# Patient Record
Sex: Male | Born: 1970 | Race: White | Hispanic: No | Marital: Married
Health system: Southern US, Community
[De-identification: ages and names within clinical notes are randomized; demographics above are authoritative.]

---

## 2000-12-12 ENCOUNTER — Encounter: Payer: Self-pay | Admitting: Gastroenterology

## 2000-12-12 ENCOUNTER — Inpatient Hospital Stay (HOSPITAL_COMMUNITY): Admission: RE | Admit: 2000-12-12 | Discharge: 2000-12-13 | Payer: Self-pay | Admitting: Emergency Medicine

## 2000-12-13 ENCOUNTER — Encounter: Payer: Self-pay | Admitting: Gastroenterology

## 2001-05-05 ENCOUNTER — Emergency Department (HOSPITAL_COMMUNITY): Admission: EM | Admit: 2001-05-05 | Discharge: 2001-05-05 | Payer: Self-pay

## 2001-05-16 ENCOUNTER — Encounter (INDEPENDENT_AMBULATORY_CARE_PROVIDER_SITE_OTHER): Payer: Self-pay | Admitting: *Deleted

## 2001-05-16 ENCOUNTER — Ambulatory Visit (HOSPITAL_COMMUNITY): Admission: RE | Admit: 2001-05-16 | Discharge: 2001-05-16 | Payer: Self-pay | Admitting: Gastroenterology

## 2007-08-10 ENCOUNTER — Ambulatory Visit (HOSPITAL_COMMUNITY): Admission: RE | Admit: 2007-08-10 | Discharge: 2007-08-10 | Payer: Self-pay | Admitting: Gastroenterology

## 2010-05-27 ENCOUNTER — Encounter
Admission: RE | Admit: 2010-05-27 | Discharge: 2010-05-27 | Payer: Self-pay | Source: Home / Self Care | Attending: Gastroenterology | Admitting: Gastroenterology

## 2010-10-02 NOTE — H&P (Signed)
[facility]  Patient:    Bobby Cruz, Bobby Cruz                      MRN: [id number] Adm. Date:  [id number] Attending:  Rich Brave CC:         Florencia Reasons, M.D.  Julieanne Cotton, M.D., [place], [place]   History and Physical  REASON FOR ADMISSION:  Nausea, vomiting and abdominal pain.  HISTORY:  A 40 year old who has had several months of intermittent abdominal pain that is left upper quadrant and feels "squishy."  He is unable to sit up.  He has severe pain in this area.  He notes that pain has been coming and going for several months and has gotten progressively worse.  It is often associated with nausea and vomiting and diarrhea.  He is unable to eat and tends to throw things back up.  He has seen Dr. Orville Govern in [place] and apparently has had labs, ultrasound and CT done over the past two or three months that failed to reveal a source of his pain.  Today, the pain was much worse to the point that he just doubled over and could barely stand up.  He denies any trauma to this area.  He has not had any hematemesis, heartburn, indigestion.  He took a bottle of Mylanta without any improvement and took some Pepto-Bismol without improvement.  Given all this, he came to the emergency room.  CURRENT MEDICATIONS:  None.  ALLERGIES:  He is allergic to PENICILLIN and ERYTHROMYCIN.  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  He has recently had gastroesophageal reflux disease and has been treated in the past.  He has also had a colonoscopy for removal of juvenile polyps.  He has never had any abdominal surgery.  FAMILY HISTORY:  Parents have no chronic GI diseases.  SOCIAL HISTORY:  He is not a smoker or drinks significantly.  He does have a significant other who lives with him and a lot of the history is obtained from her.  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  PHYSICAL EXAMINATION:  VITAL SIGNS:  Patient is afebrile.  Pulse 101, blood pressure 118/86, supine, rising to 114 and blood pressure 124/83, standing.  HEENT:  Eyes:  Sclerae nonicteric.  NECK:  Supple.  No lymphadenopathy.  LUNGS:  Clear.  HEART:  Regular rate and rhythm without murmurs or gallops.  ABDOMEN:  Soft with good bowel sounds.  There is mild localized tenderness in the left upper quadrant, possibly worse with a deep breath.  No masses are palpated.  RECTAL:  Stool is brown and heme-negative.  PERTINENT LABORATORY DATA:  CBC and CMET all normal.  Acute abdominal series shows mild-to-moderate ileus throughout, particularly in the left upper quadrant.  ASSESSMENT:  Nausea, vomiting and left upper quadrant pain with an ileus on films could all be simply a viral gastroenteritis, but given the nature of this and the fact that things have gotten so much worse today, I think we ought to rule out some sort of splenic lesion.  It is also possible this could be due to kidney stone, although its location is a bit unusual.  PLAN: 1. We will put him in on evaluation and watch him with IV fluids in case    there is any decompensation. 2. We will obtain a  CT of the abdomen to rule out a splenic infarct, etc. 3. We will put him empirically on PPI. 4. We will check a urinalysis. DD:  12/12/00 TD:  12/13/00 Job: [id number]

## 2010-10-02 NOTE — Discharge Summary (Signed)
[facility]  Patient:    Bobby, Cruz                      MRN: [id number] Adm. Date:  [id number] Disc. Date: [id number] Attending:  Rich Brave CC:         Julieanne Cotton, M.D., [place], [place]   Discharge Summary  FINAL DIAGNOSES: 1. Nausea, vomiting, abdominal pain, and diarrhea of unclear cause, subacute,    improved. 2. Chronic left upper quadrant pain. 3. Skin rash, etiology unclear, acute. 4. History of childhood brain tumor.  CONSULTATIONS:  None.  COMPLICATIONS:  None.  PROCEDURES:  CT scan of the abdomen and pelvis.  LABORATORY DATA:  CMET normal on admission except for potassium of 3.3 and total bilirubin of 1.3.  Amylase low normal at 15.  White count normal at 8200, with 75 polys and 16 lymphs.  Hemoglobin 15.1.  HOSPITAL COURSE:  Bobby Cruz was admitted through the emergency room because of symptoms of nausea, vomiting, abdominal pain, and diarrhea of approximately three days duration, associated also with a low-grade fever. There was a question of an ileus on his admitting plain abdominal film. Laboratories and exam were unremarkable, and there was no clinical evidence of dehydration such as elevation of BUN, which was less than 5 on admission.  He was brought in, given Stadol for abdominal pain, and IV fluids.  A CT scan of the abdomen and pelvis was performed the next morning, which showed a small amount of free fluid in the pelvis which was of uncertain clinical significance.  His symptoms resolved.  Specifically, he had no further diarrhea, with resolution of his abdominal pain following the Stadol, and was able to take liquids without any vomiting and without significant nausea. Discharge to home was, therefore, felt to be appropriate.  In the meantime, he developed an erythematous, urticarial-type reaction or skin rash in his axillae, groin, his waistband, around his forehead, and at the creases of his  forearms.  This was mildly pruritic.  This appeared approximately six hours after a CT scan on the day of discharge and is presumed most likely to be either drug rash or possibly a viral skin eruption. It was not felt sufficiently severe to necessitate keeping the patient in the hospital, and the patient, indeed, felt ready to go home.  DISPOSITION:  The patient is discharged home.  DISCHARGE INSTRUCTIONS:  We will provide him a kit to collect a stool specimen for ova and parasites.  He may engage in light activity as tolerated, and consume a regular diet, to be advanced as tolerated beginning with small light meals.  He is to call if he has recurrent symptoms which are not relieved by symptomatic therapy (see below).  FOLLOW-UP:  He should call for an appointment with me for follow-up in about two to three weeks.  DISCHARGE MEDICATIONS: 1. Benadryl 25 mg p.o. q.4h. p.r.n. for rash or itching. 2. Vicodin 1-2 tablets q.4h. p.r.n. for cramps, abdominal pain, or diarrhea. 3. Phenergan 25 mg 1/2 to 1 tablet p.o. q.6h. p.r.n. nausea. 4. Imodium over-the-counter 2 caplets every four hours as needed for diarrhea.  CONDITION ON DISCHARGE:  Improved. DD:  12/13/00 TD:  12/14/00 Job: [id number]

## 2010-10-02 NOTE — Procedures (Signed)
[place]  Patient:    KEWON, STATLER Visit Number: [id number] MRN: [id number]          Service Type: END Location: ENDO Attending Physician:  Rich Brave Dictated by:   Florencia Reasons, M.D. Proc. Date: 05/16/01 Admit Date:  05/16/2001   CC:         Sheppard Plumber. Earlene Plater, M.D.   Procedure Report  PROCEDURE:  Colonoscopy with biopsies.  INDICATION:  A 40 year old with transiently Hemoccult-positive stool and a history of recurring left-sided abdominal pain and diarrhea.  He also had a recent episode of minimal hematochezia.  FINDINGS:  Diminutive rectal polyp, otherwise normal to terminal ileum.  DESCRIPTION OF PROCEDURE:  The nature, purpose, and risks of the procedure were familiar to the patient, who provided written consent.  Sedation for this procedure and the upper endoscopy which preceded it totalled fentanyl 90 mcg and Versed 9 mg IV without arrhythmias or desaturation.  The Olympus adult video colonoscope was advanced around a somewhat redundant colon following an unremarkable digital rectal exam, including the prostate gland.  With the patient in the supine and right lateral decubitus positions and with external abdominal compression and the patient back in the supine position, we were able to reach the cecum and then I was able to enter the terminal ileum for quite a distance, probably 20 or 25 cm.  It had a completely normal appearance, but several biopsies were obtained.  Pullback was then performed around the colon.  The quality of the prep was fair.  There was puddling of liquid stool here and there, and it was difficult to suction this off due to the presence of some retained vegetable matter.  However, it is not felt that any significant lesions or diffuse mucosal abnormalities would have been missed during this exam.  Retroflexion in the rectum was unremarkable, although again there was some puddling of  stool in the rectum.  A 2 mm distal rectal polyp was removed by a single cold biopsy.  Random mucosal biopsies were obtained along the length of the colon.  Both retroflex viewing and pull-out through the anal canal demonstrated no major abnormalities, just some hypertrophied anal papillae.  Specifically, I did not see significant internal hemorrhoids.  The patient tolerated the procedure well, and there were no apparent complications.  IMPRESSION: 1. Redundant colon, probable anatomic variant. 2. No source of heme-positive stool identified. 3. No definite source of rectal bleeding identified, although is is presumed    to be due to hemorrhoids given the absence of any alternative explanation. 4. Diminutive rectal polyp. 5. No source or abdominal pain or diarrhea seen.  PLAN:  Await pathology on biopsies. Dictated by:   Florencia Reasons, M.D. Attending Physician:  Rich Brave DD:  05/16/01 TD:  05/16/01 Job: [id number]

## 2010-10-02 NOTE — Procedures (Signed)
[name]  Patient:    Bobby Cruz, Bobby Cruz Visit Number: [id number] MRN: [id number]          Service Type: END Location: ENDO Attending Physician:  Rich Brave Dictated by:   Florencia Reasons, M.D. Proc. Date: 05/16/01 Admit Date:  05/16/2001   CC:         Sheppard Plumber. Earlene Plater, M.D.   Procedure Report  PROCEDURE:  Upper endoscopy with biopsies.  INDICATION:  This is a 40 year old young man with transiently Hemoccult-positive stool and recurring unexplained symptoms of diarrhea and abdominal pain.  FINDINGS:  Minimal hiatal hernia and esophageal ring.  DESCRIPTION OF PROCEDURE:  The nature, purpose, and risks of the procedure were familiar to the patient, who provided written consent.  Sedation for this procedure was fentanyl 75 mcg and Versed 7.5 mg IV without arrhythmias or desaturation.  The Olympus small-caliber adult video endoscope is passed under direct vision without difficulty.  The vocal cords looked normal.  The esophagus was readily entered.  The esophageal mucosa was normal, and specifically there was no evidence of reflux esophagitis, Barretts esophagus, free gastroesophageal reflux, varices, infection, or neoplasia.  There was a fairly prominent squamocolumnar junction, below which was a 1-2 cm hiatal hernia.  No discrete esophageal ring was identified during this exam, however.  The stomach contained no significant residual and had normal mucosa.  The minimal hiatal hernia was visible from the inferior perspective.  No gastritis, erosions, ulcers, polyps, or masses were noted.  The pylorus, duodenal bulb, second and third portions of the duodenum looked normal. Multiple duodenal biopsies were obtained to help rule out celiac disease, but the villous pattern of the duodenal mucosa looked normal.  The patient tolerated the procedure well, and there were no apparent complications.  IMPRESSION:  Small hiatal hernia.   Otherwise normal endoscopy.  PLAN:  Await pathology on duodenal biopsies.  Proceed to colonoscopic evaluation. Dictated by:   Florencia Reasons, M.D. Attending Physician:  Rich Brave DD:  05/16/01 TD:  05/16/01 Job: [id number]

## 2012-08-22 ENCOUNTER — Other Ambulatory Visit (HOSPITAL_COMMUNITY): Payer: Self-pay | Admitting: Gastroenterology

## 2012-08-22 DIAGNOSIS — R109 Unspecified abdominal pain: Secondary | ICD-10-CM

## 2012-08-22 DIAGNOSIS — R110 Nausea: Secondary | ICD-10-CM

## 2012-08-25 ENCOUNTER — Other Ambulatory Visit (HOSPITAL_COMMUNITY): Payer: Self-pay

## 2012-09-05 ENCOUNTER — Ambulatory Visit
Admission: RE | Admit: 2012-09-05 | Discharge: 2012-09-05 | Disposition: A | Discharge status: Home / Self Care | Payer: BC Managed Care – PPO | Source: Ambulatory Visit | Attending: Gastroenterology | Admitting: Gastroenterology

## 2012-09-05 ENCOUNTER — Other Ambulatory Visit: Payer: Self-pay | Admitting: Gastroenterology

## 2012-09-05 ENCOUNTER — Encounter (HOSPITAL_COMMUNITY)
Admission: RE | Admit: 2012-09-05 | Discharge: 2012-09-05 | Disposition: A | Discharge status: Home / Self Care | Payer: BC Managed Care – PPO | Source: Ambulatory Visit | Attending: Gastroenterology | Admitting: Gastroenterology

## 2012-09-05 DIAGNOSIS — R110 Nausea: Secondary | ICD-10-CM | POA: Insufficient documentation

## 2012-09-05 DIAGNOSIS — R140 Abdominal distension (gaseous): Secondary | ICD-10-CM

## 2012-09-05 DIAGNOSIS — R109 Unspecified abdominal pain: Secondary | ICD-10-CM | POA: Insufficient documentation

## 2012-09-05 MED ORDER — TECHNETIUM TC 99M SULFUR COLLOID
2.0000 | Freq: Once | INTRAVENOUS | Status: AC | PRN
  Administered 2012-09-05: 2 via INTRAVENOUS

## 2013-06-28 ENCOUNTER — Ambulatory Visit
Admission: RE | Admit: 2013-06-28 | Discharge: 2013-06-28 | Disposition: A | Discharge status: Home / Self Care | Payer: BC Managed Care – PPO | Source: Ambulatory Visit | Attending: Gastroenterology | Admitting: Gastroenterology

## 2013-06-28 ENCOUNTER — Other Ambulatory Visit: Payer: Self-pay | Admitting: Gastroenterology

## 2013-06-28 DIAGNOSIS — R1084 Generalized abdominal pain: Secondary | ICD-10-CM

## 2014-11-19 IMAGING — CR DG ABDOMEN 1V
2 series · 2 of 2 positions shown · non-contrast
Comparison: None.

CLINICAL DATA: Abdominal pain, bloating.

ABDOMEN - 1 VIEW

[t abdomen supine (1 of 2)]
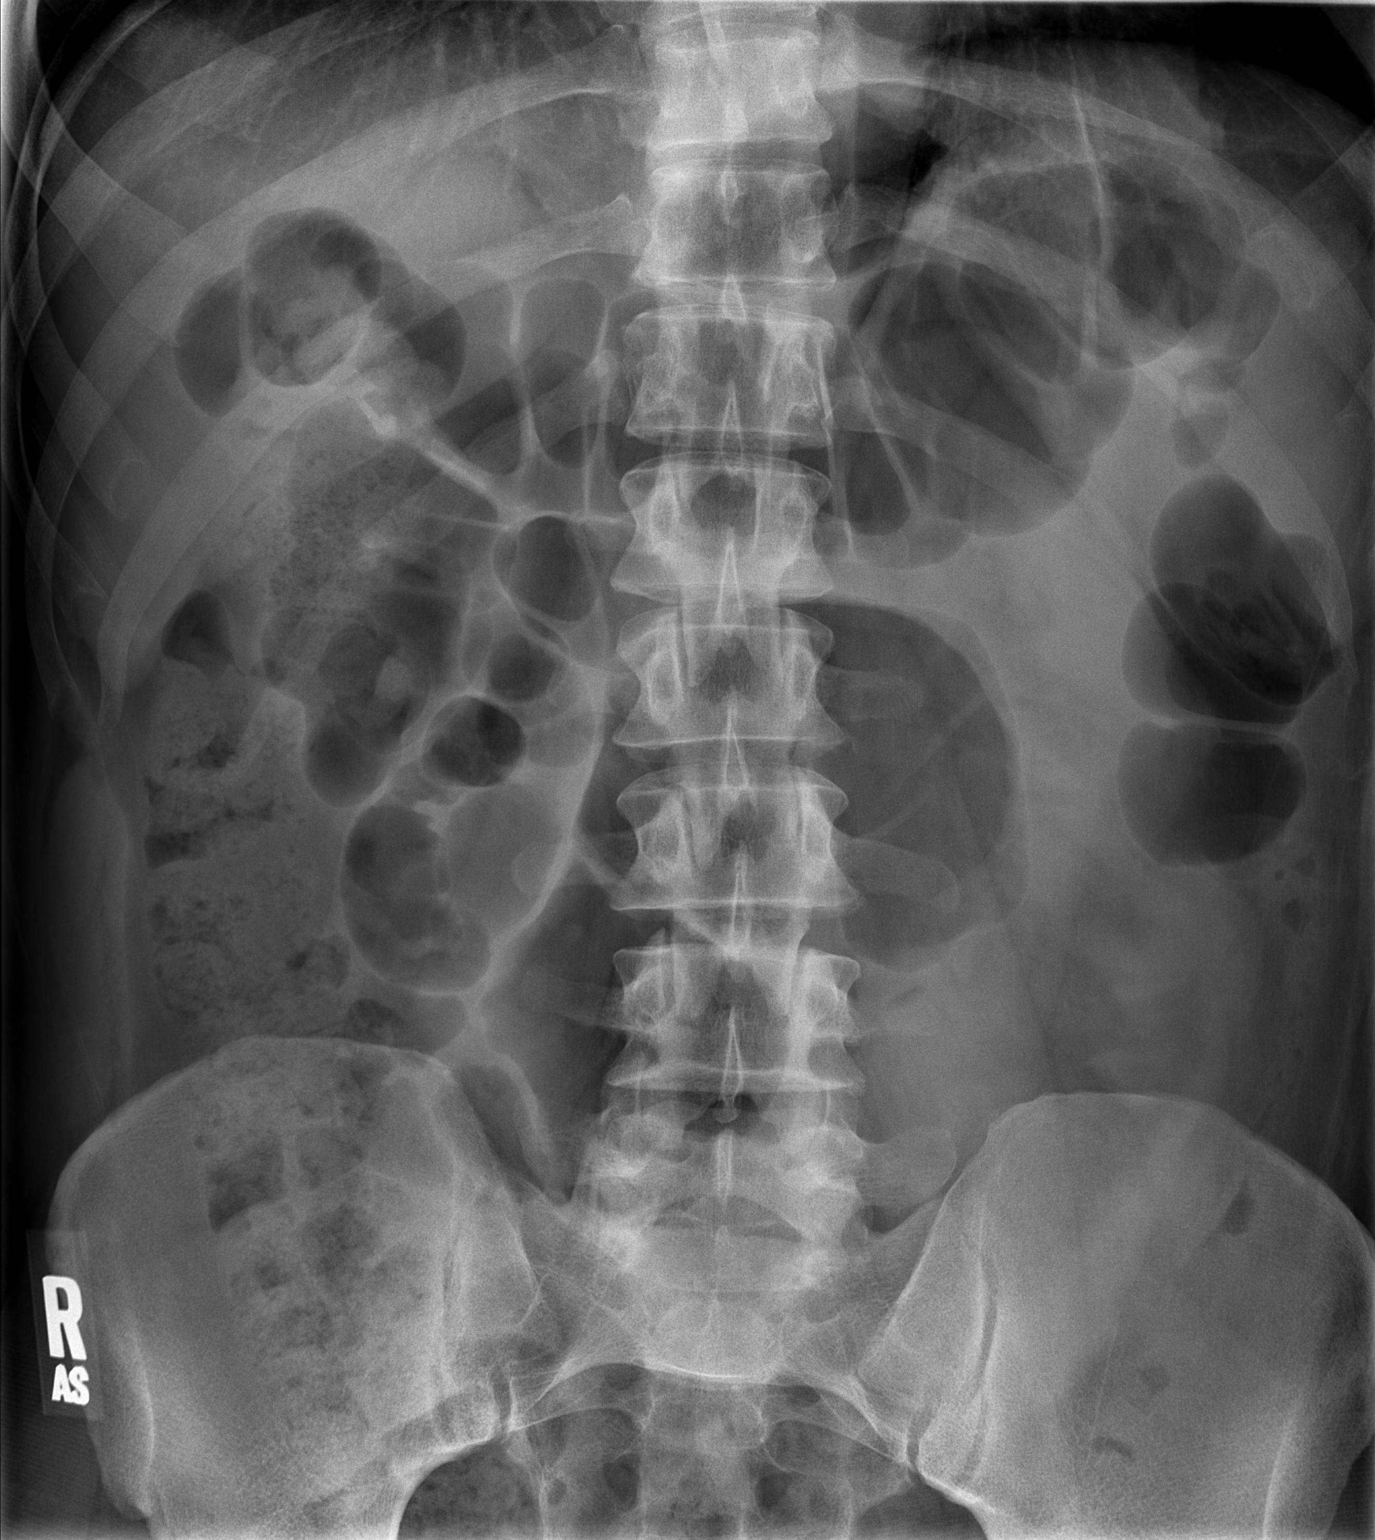

[t abdomen supine (2 of 2)]
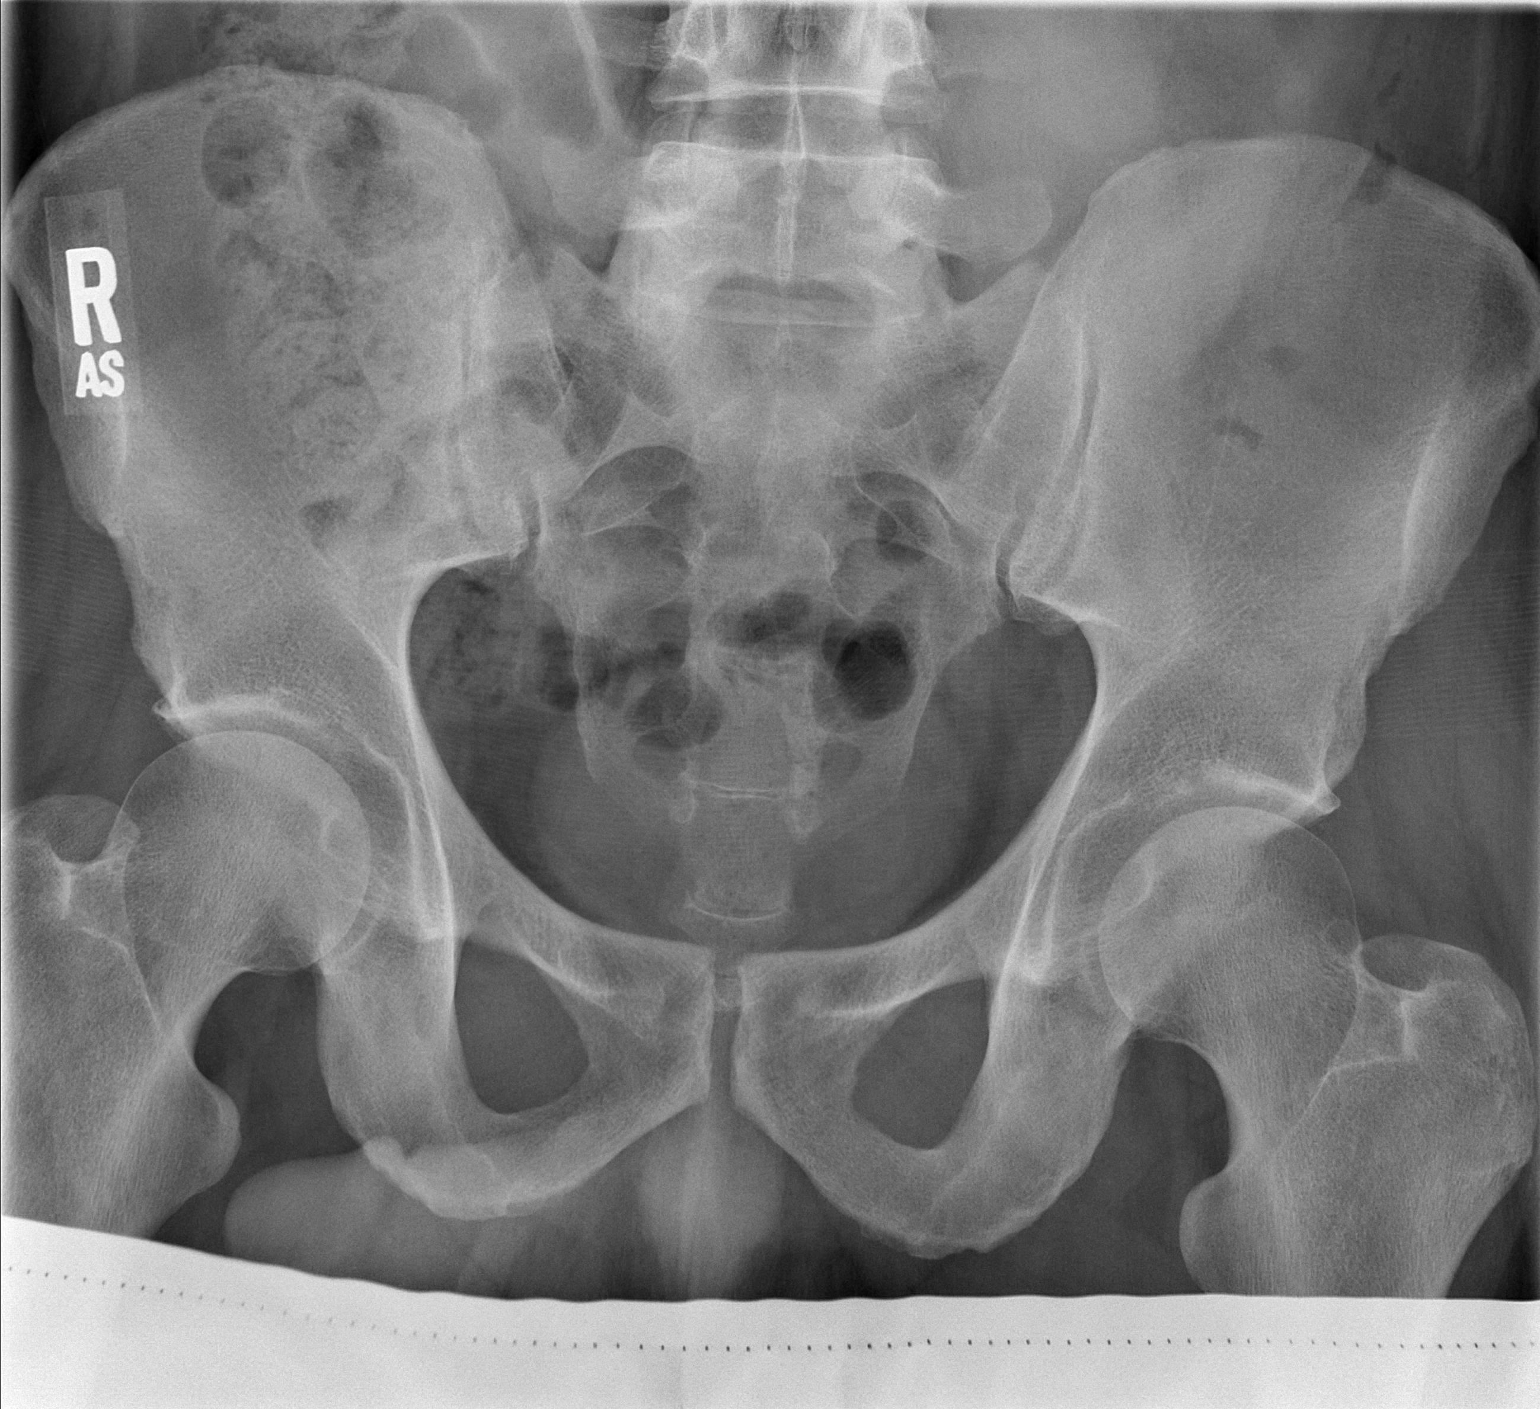

[2 of 2 positions shown; findings below may reference images not displayed]

FINDINGS: Mild gaseous distention of the colon.  Moderate stool
burden in the right side of the colon.  No evidence of bowel
obstruction.  No free air.  No organomegaly or suspicious
calcification.  No acute bony abnormality.
IMPRESSION: Mild stool burden in the right colon with mild gaseous distention
of the colon.

## 2015-09-11 IMAGING — CR DG ABDOMEN 2V
2 series · 2 of 2 positions shown · non-contrast
Comparison: 09/03/2012

CLINICAL DATA: Nausea and constipation.

EXAM:
ABDOMEN - 2 VIEW

[w abdomen upright *]
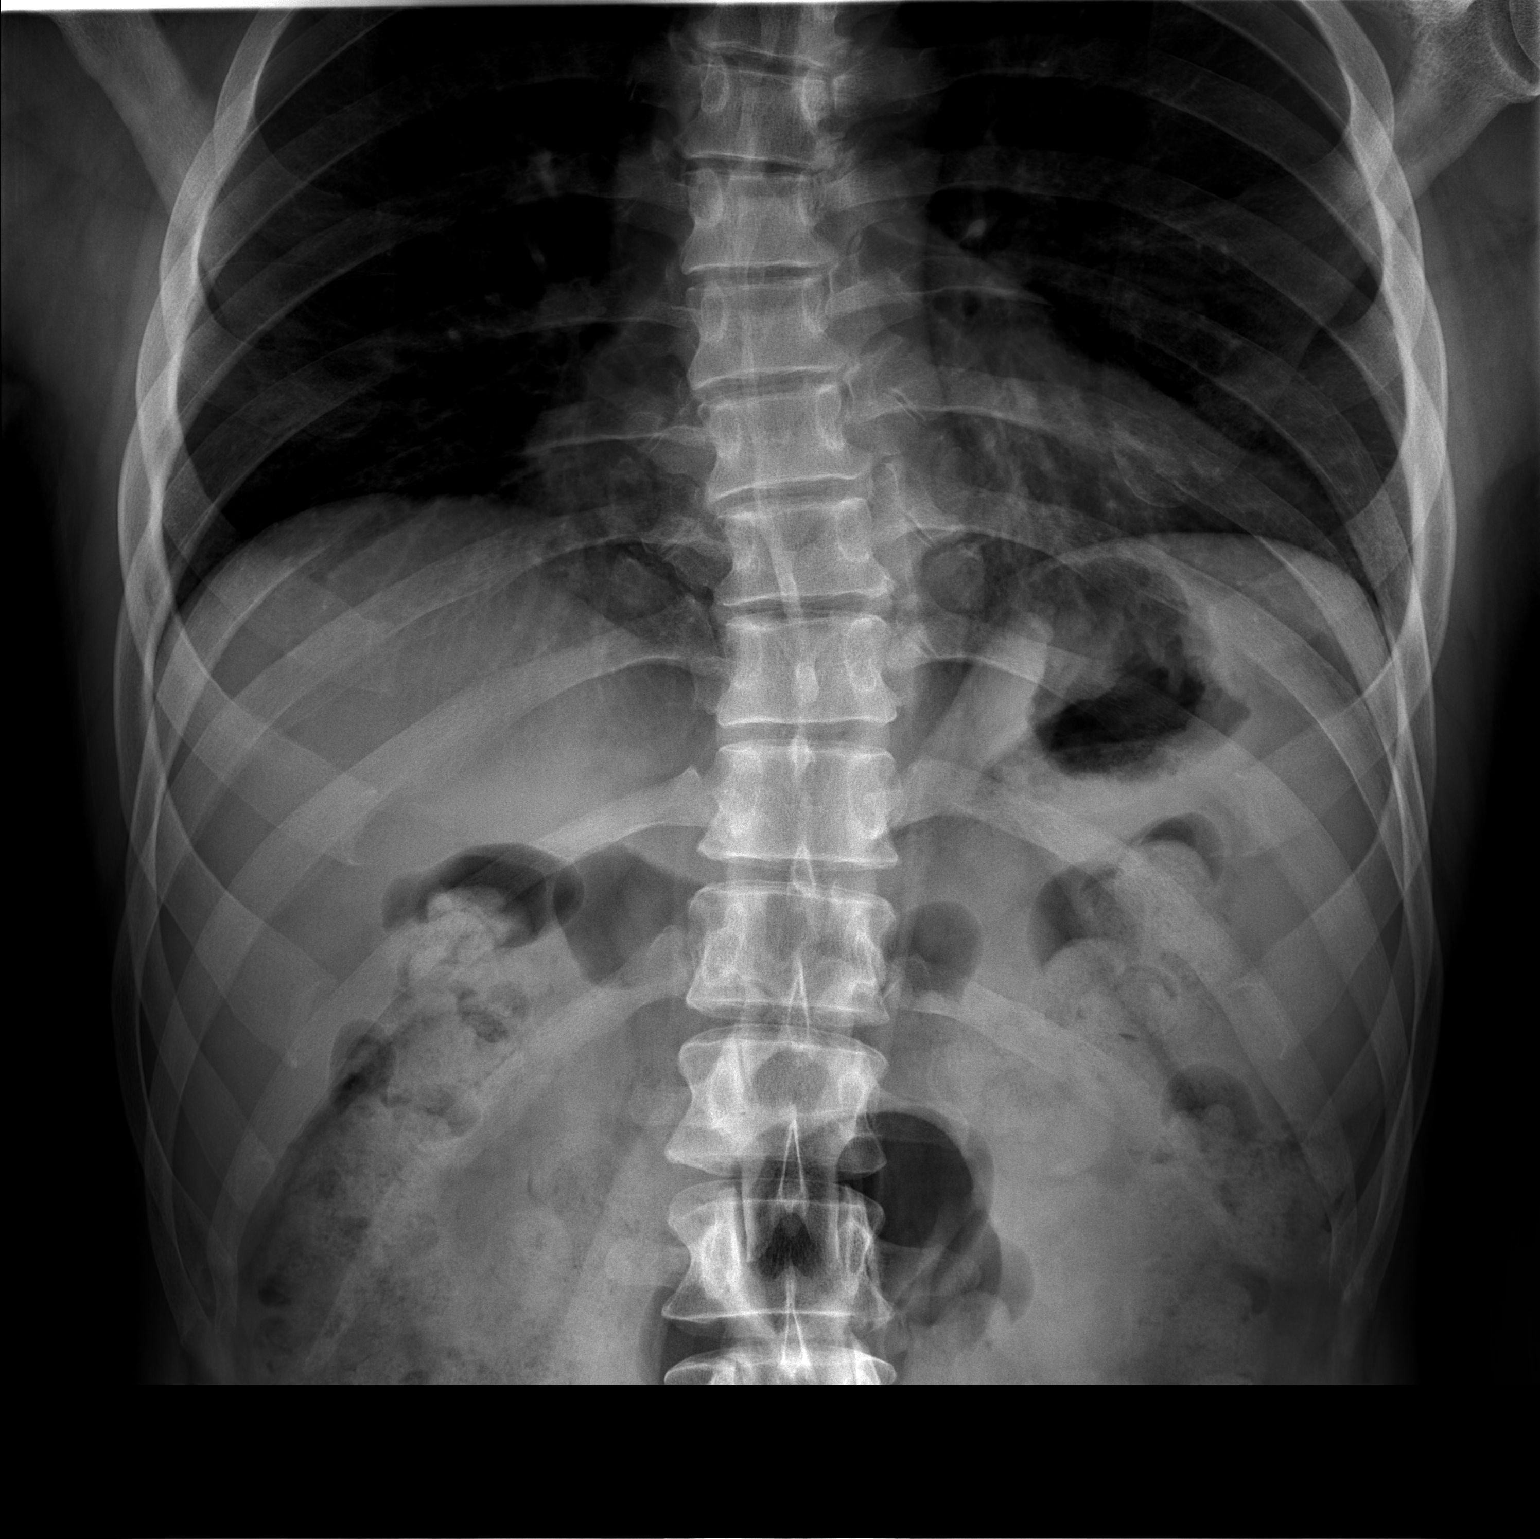

[t abdomen supine]
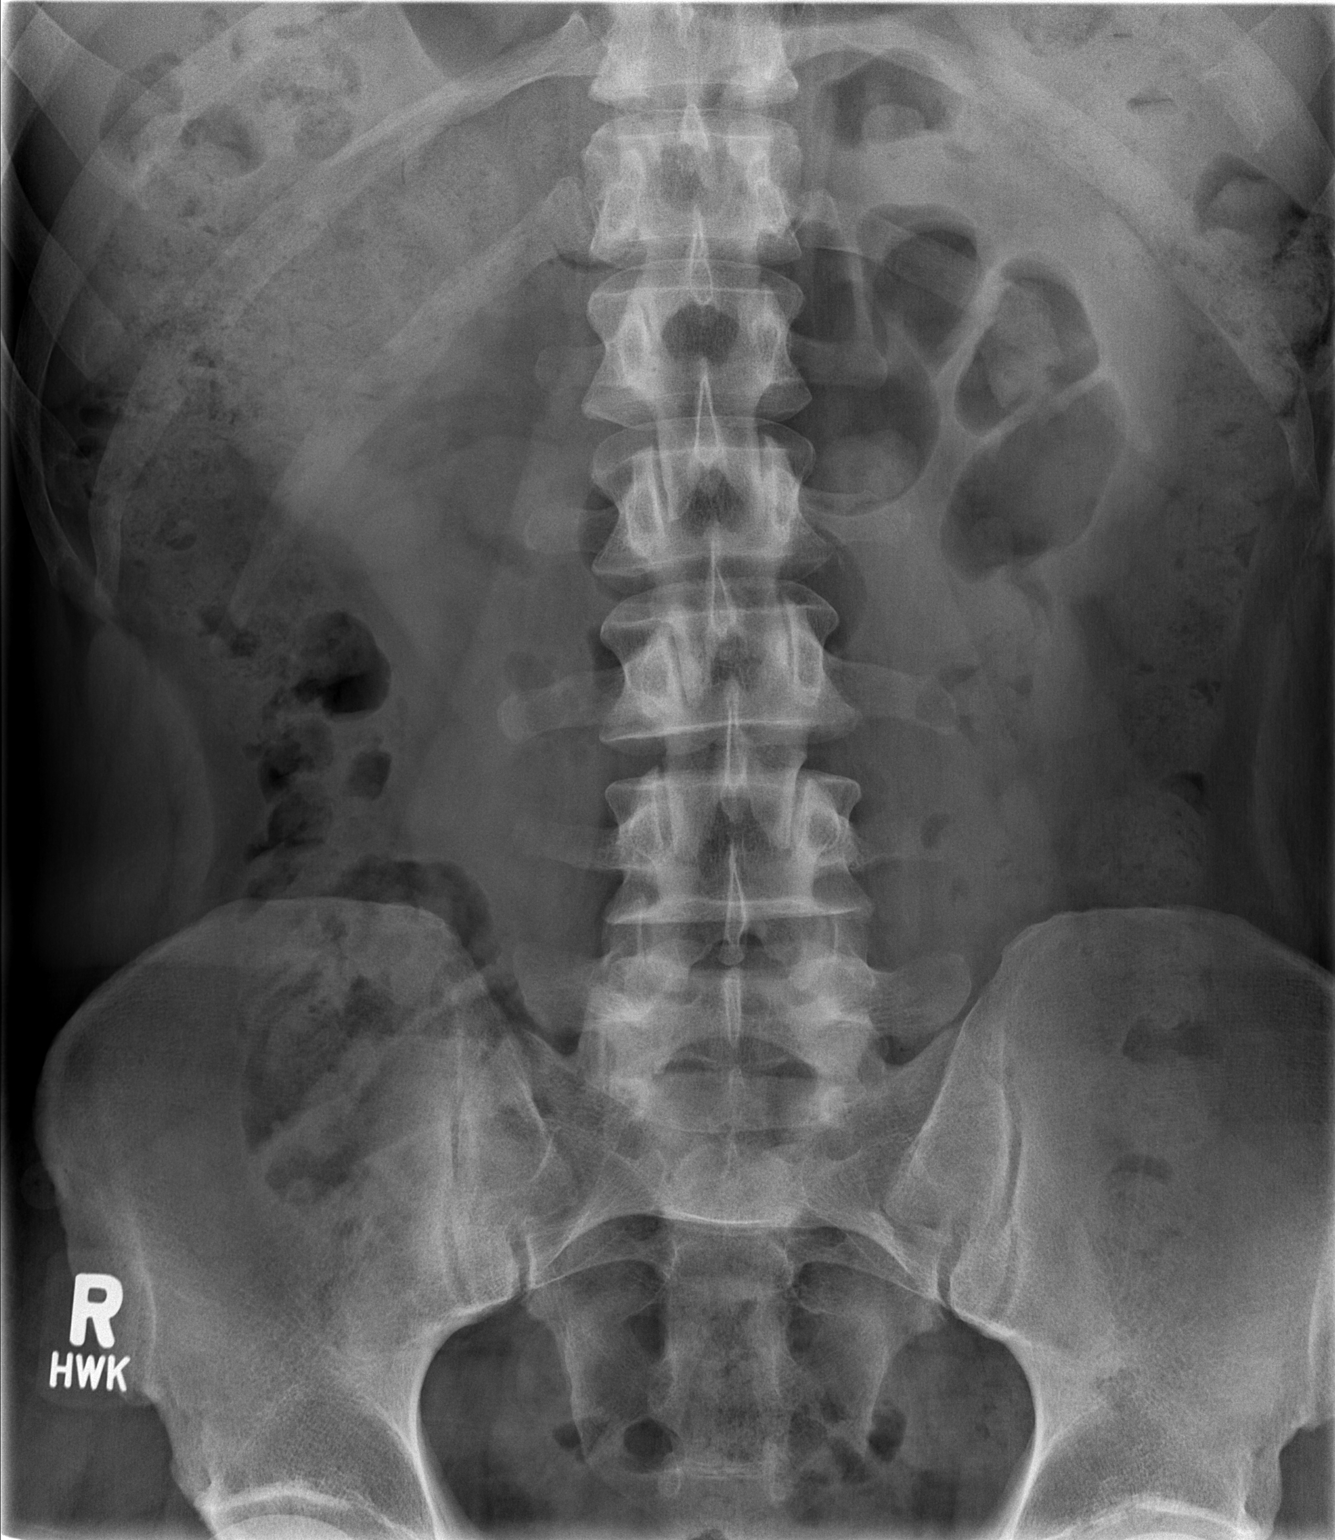

[2 of 2 positions shown; findings below may reference images not displayed]

FINDINGS: The lung bases are grossly clear. There is moderate stool throughout
the colon. No distended small bowel loops to suggest obstruction.
The soft tissue shadows are maintained. The bony structures are
unremarkable.
IMPRESSION: Moderate stool throughout the colon suggesting constipation. No
findings for small bowel obstruction or free air.
